# Patient Record
Sex: Female | Born: 1970 | Race: Black or African American | Hispanic: No | Marital: Single | Smoking: Never smoker
Health system: Southern US, Community
[De-identification: ages and names within clinical notes are randomized; demographics above are authoritative.]

## PROBLEM LIST (undated history)

## (undated) DIAGNOSIS — R55 Syncope and collapse: Secondary | ICD-10-CM

## (undated) DIAGNOSIS — R7611 Nonspecific reaction to tuberculin skin test without active tuberculosis: Secondary | ICD-10-CM

## (undated) DIAGNOSIS — S8262XA Displaced fracture of lateral malleolus of left fibula, initial encounter for closed fracture: Secondary | ICD-10-CM

## (undated) HISTORY — DX: Nonspecific reaction to tuberculin skin test without active tuberculosis: R76.11

## (undated) HISTORY — DX: Syncope and collapse: R55

---

## 2006-03-18 ENCOUNTER — Encounter: Admission: RE | Admit: 2006-03-18 | Discharge: 2006-03-18 | Payer: Self-pay | Admitting: Obstetrics and Gynecology

## 2011-11-07 LAB — HM PAP SMEAR

## 2012-04-06 ENCOUNTER — Ambulatory Visit (HOSPITAL_BASED_OUTPATIENT_CLINIC_OR_DEPARTMENT_OTHER)
Admission: RE | Admit: 2012-04-06 | Discharge: 2012-04-06 | Disposition: A | Discharge status: Home / Self Care | Payer: BC Managed Care – PPO | Source: Ambulatory Visit | Attending: Family Medicine | Admitting: Family Medicine

## 2012-04-06 ENCOUNTER — Encounter: Payer: Self-pay | Admitting: Family Medicine

## 2012-04-06 ENCOUNTER — Ambulatory Visit (INDEPENDENT_AMBULATORY_CARE_PROVIDER_SITE_OTHER): Payer: BC Managed Care – PPO | Admitting: Family Medicine

## 2012-04-06 VITALS — BP 116/76 | HR 68 | Temp 98.4°F | Ht 66.0 in | Wt 178.8 lb

## 2012-04-06 DIAGNOSIS — Z0000 Encounter for general adult medical examination without abnormal findings: Secondary | ICD-10-CM | POA: Insufficient documentation

## 2012-04-06 DIAGNOSIS — T148 Other injury of unspecified body region: Secondary | ICD-10-CM

## 2012-04-06 DIAGNOSIS — E049 Nontoxic goiter, unspecified: Secondary | ICD-10-CM

## 2012-04-06 DIAGNOSIS — T148XXA Other injury of unspecified body region, initial encounter: Secondary | ICD-10-CM

## 2012-04-06 DIAGNOSIS — E010 Iodine-deficiency related diffuse (endemic) goiter: Secondary | ICD-10-CM | POA: Insufficient documentation

## 2012-04-06 DIAGNOSIS — W57XXXA Bitten or stung by nonvenomous insect and other nonvenomous arthropods, initial encounter: Secondary | ICD-10-CM

## 2012-04-06 LAB — LIPID PANEL
Cholesterol: 172 mg/dL (ref 0–200)
HDL: 83.8 mg/dL (ref >39.00)
Total CHOL/HDL Ratio: 2
Triglycerides: 99 mg/dL (ref 0.0–149.0)
VLDL: 19.8 mg/dL (ref 0.0–40.0)

## 2012-04-06 LAB — BASIC METABOLIC PANEL
CO2: 30 mEq/L (ref 19–32)
Calcium: 9.1 mg/dL (ref 8.4–10.5)
Creatinine, Ser: 0.8 mg/dL (ref 0.4–1.2)
GFR: 100.69 mL/min (ref >60.00)
Glucose, Bld: 99 mg/dL (ref 70–99)
Sodium: 140 mEq/L (ref 135–145)

## 2012-04-06 LAB — TSH: TSH: 0.5 u[IU]/mL (ref 0.35–5.50)

## 2012-04-06 LAB — CBC WITH DIFFERENTIAL/PLATELET
Basophils Absolute: 0 10*3/uL (ref 0.0–0.1)
HCT: 38.9 % (ref 36.0–46.0)
Lymphs Abs: 1.7 10*3/uL (ref 0.7–4.0)
MCHC: 31 g/dL (ref 30.0–36.0)
MCV: 75 fl — ABNORMAL LOW (ref 78.0–100.0)
Monocytes Relative: 8.5 % (ref 3.0–12.0)
Neutro Abs: 2.2 10*3/uL (ref 1.4–7.7)
RBC: 5.18 Mil/uL — ABNORMAL HIGH (ref 3.87–5.11)
RDW: 15 % — ABNORMAL HIGH (ref 11.5–14.6)

## 2012-04-06 MED ORDER — TRIAMCINOLONE ACETONIDE 0.1 % EX OINT: TOPICAL_OINTMENT | Freq: Two times a day (BID) | CUTANEOUS | Status: AC

## 2012-04-06 NOTE — Assessment & Plan Note (Signed)
New.  Get [place] and labs to assess.  Will follow.

## 2012-04-06 NOTE — Progress Notes (Signed)
  Subjective:    Patient ID: Lori Gibson, female    DOB: 07-May-1971, 41 y.o.   MRN: [id number]  HPI New to establish.  GYN- [facility]  Has never had PCP physical.  UTD on GYN.  No concerns today.   Review of Systems Patient reports no vision/ hearing changes, adenopathy,fever, weight change,  persistant/recurrent hoarseness , swallowing issues, chest pain, palpitations, edema, persistant/recurrent cough, hemoptysis, dyspnea (rest/exertional/paroxysmal nocturnal), gastrointestinal bleeding (melena, rectal bleeding), abdominal pain, significant heartburn, bowel changes, GU symptoms (dysuria, hematuria, incontinence), Gyn symptoms (abnormal  bleeding, pain),  syncope, focal weakness, memory loss, numbness & tingling, skin/hair/nail changes, abnormal bruising or bleeding, anxiety, or depression.     Objective:   Physical Exam General Appearance:    Alert, cooperative, no distress, appears stated age  Head:    Normocephalic, without obvious abnormality, atraumatic  Eyes:    PERRL, conjunctiva/corneas clear, EOM's intact, fundi    benign, both eyes  Ears:    Normal TM's and external ear canals, both ears  Nose:   Nares normal, septum midline, mucosa normal, no drainage    or sinus tenderness  Throat:   Lips, mucosa, and tongue normal; teeth and gums normal  Neck:   Supple, symmetrical, trachea midline, no adenopathy;    Thyroid: diffusely enlarged, no tenderness/nodules  Back:     Symmetric, no curvature, ROM normal, no CVA tenderness  Lungs:     Clear to auscultation bilaterally, respirations unlabored  Chest Wall:    No tenderness or deformity   Heart:    Regular rate and rhythm, S1 and S2 normal, no murmur, rub   or gallop  Breast Exam:    Deferred to GYN  Abdomen:     Soft, non-tender, bowel sounds active all four quadrants,    no masses, no organomegaly  Genitalia:    Deferred to GYN  Rectal:    Extremities:   Extremities normal, atraumatic, no cyanosis or edema  Pulses:   2+ and  symmetric all extremities  Skin:   Skin color, texture, turgor normal, multiple insect bites on abdomen  Lymph nodes:   Cervical, supraclavicular, and axillary nodes normal  Neurologic:   CNII-XII intact, normal strength, sensation and reflexes    throughout          Assessment & Plan:

## 2012-04-06 NOTE — Patient Instructions (Addendum)
We'll notify you of your lab results and make any changes if needed We'll call you with your [place] results Keep up the good work!  You look great! Use the Triamcinolone twice daily for itching as needed Call with any questions or concerns Think of [place] as your home base! Welcome!  We're glad to have you!!!

## 2012-04-06 NOTE — Assessment & Plan Note (Signed)
New.  Start triamcinolone ointment prn.  Reviewed supportive care and red flags that should prompt return.  Pt expressed understanding and is in agreement w/ plan.

## 2012-04-06 NOTE — Assessment & Plan Note (Signed)
New.  Pt's PE WNL w/ exception of thyromegaly.  Check labs.  Get [place].  Anticipatory guidance provided.

## 2012-04-07 ENCOUNTER — Encounter: Payer: Self-pay | Admitting: *Deleted

## 2012-04-10 LAB — VITAMIN D 1,25 DIHYDROXY: Vitamin D2 1, 25 (OH)2: <8 pg/mL

## 2012-05-04 ENCOUNTER — Ambulatory Visit (INDEPENDENT_AMBULATORY_CARE_PROVIDER_SITE_OTHER): Payer: BC Managed Care – PPO | Admitting: Family Medicine

## 2012-05-04 ENCOUNTER — Encounter: Payer: Self-pay | Admitting: Family Medicine

## 2012-05-04 VITALS — BP 105/68 | HR 83 | Temp 98.4°F | Ht 66.0 in | Wt 178.0 lb

## 2012-05-04 DIAGNOSIS — R55 Syncope and collapse: Secondary | ICD-10-CM | POA: Insufficient documentation

## 2012-05-04 NOTE — Progress Notes (Signed)
  Subjective:    Patient ID: Lori Gibson, female    DOB: 1971-01-21, 41 y.o.   MRN: [id number]  HPI Hospital f/u- passed out on 7/28 during exercise (doing sprints) in the early evening.  'i just went down' after completing a 1 lap sprint.  Felt very winded and when she went to get water, 'i don't remember anything else'.  Was out for <5 minutes.  There was report of 'seizure like' activity for 10-15 seconds consisting of 'eyes rolling back and twitching and unresponsive'.  No incontinence.  No tongue biting.  No involuntary movement of extremities.  Had normal head CT in ER ([facility]), sinus arrhythmia on EKG, and normal labs.  Pt was told in ER that BP was low.  Yesterday was extremely tired, today feeling better.   Review of Systems For ROS see HPI     Objective:   Physical Exam  Vitals reviewed. Constitutional: She is oriented to person, place, and time. She appears well-developed and well-nourished. No distress.  HENT:  Head: Normocephalic and atraumatic.  Eyes: Conjunctivae and EOM are normal. Pupils are equal, round, and reactive to light.  Neck: Normal range of motion. Neck supple. No thyromegaly present.  Cardiovascular: Normal rate, regular rhythm, normal heart sounds and intact distal pulses.   No murmur heard. Pulmonary/Chest: Effort normal and breath sounds normal. No respiratory distress.  Abdominal: Soft. She exhibits no distension. There is no tenderness.  Musculoskeletal: She exhibits no edema.  Lymphadenopathy:    She has no cervical adenopathy.  Neurological: She is alert and oriented to person, place, and time.  Skin: Skin is warm and dry.  Psychiatric: She has a normal mood and affect. Her behavior is normal.          Assessment & Plan:

## 2012-05-04 NOTE — Assessment & Plan Note (Signed)
New.  Reviewed ER record from [facility].  Pt's report of events is more consistent w/ vagal episode after becoming winded, leaning forward on knees and then standing upright to get water.  Based on witnessed collapse and lack of incontinence, tongue biting, involuntary extremity movement, post-ictal state or electrolyte abnormalities- true seizure is unlikely.  Pt not orthostatic in office today.  Encouraged increased hydration, increased salt intake and easing into exercise.  Reviewed supportive care and red flags that should prompt return.  Pt expressed understanding and is in agreement w/ plan.

## 2012-05-04 NOTE — Patient Instructions (Addendum)
The good news is that you are not orthostatic here in the office Drink plenty of fluids Increase your salt intake! REST!  You energy will return shortly Call with any questions or concerns Hang in there!!!

## 2013-04-14 IMAGING — US US SOFT TISSUE HEAD/NECK
1 series · 13 of 25 positions shown · non-contrast
Comparison: None

CLINICAL DATA: Thyromegaly

THYROID ULTRASOUND
TECHNIQUE: Ultrasound examination of the thyroid gland and adjacent
soft tissues was performed.

[Series 1: us soft tissue head/neck · 0.08mm/px · 13 of 49 slices shown]
[im 1/49]
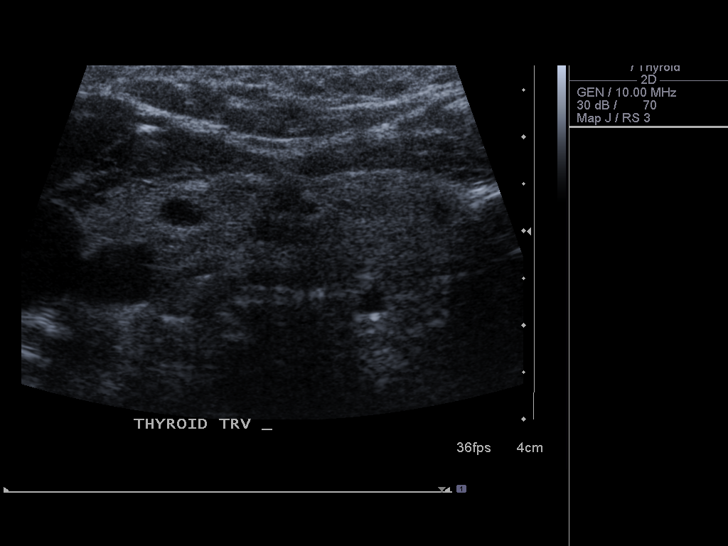
[im 5/49]
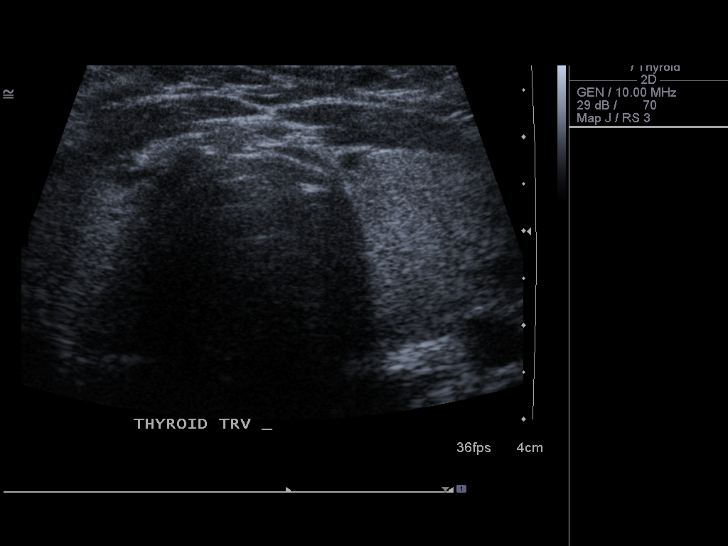
[im 9/49]
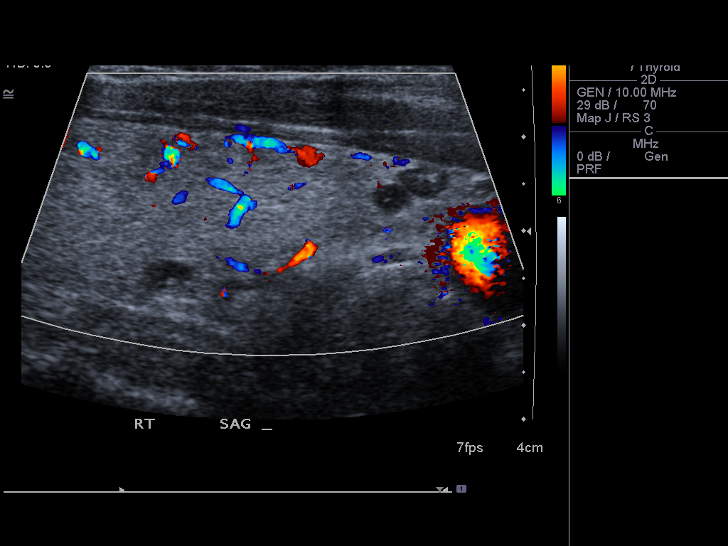
[im 13/49]
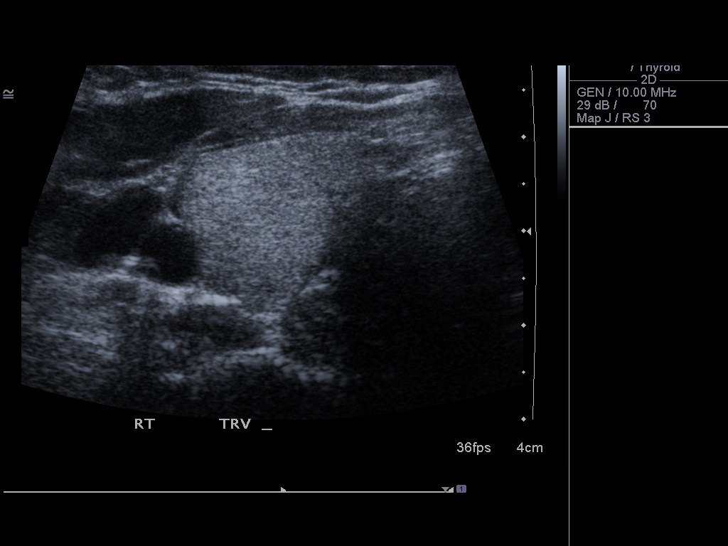
[im 17/49]
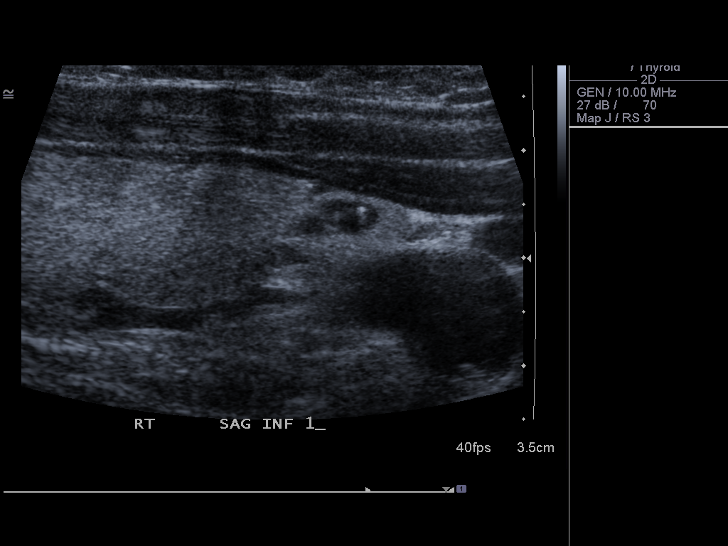
[im 21/49]
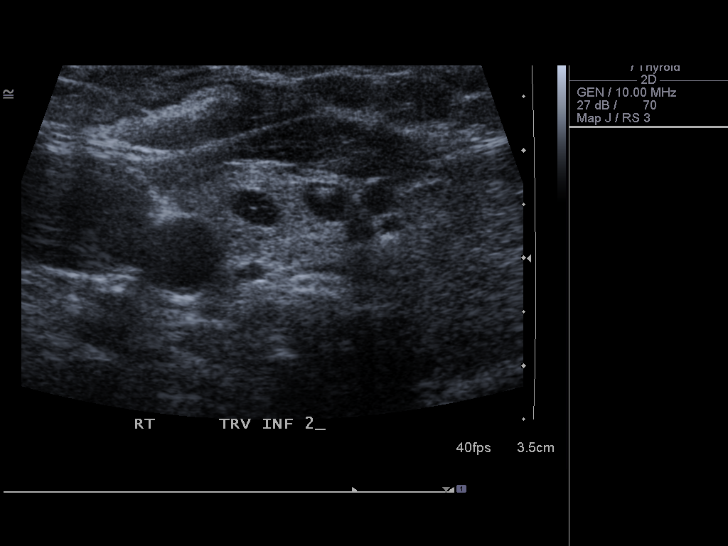
[im 25/49]
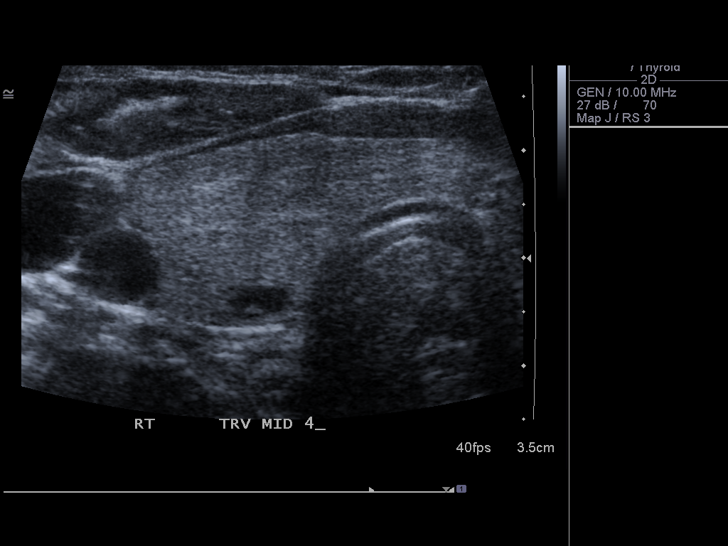
[im 29/49]
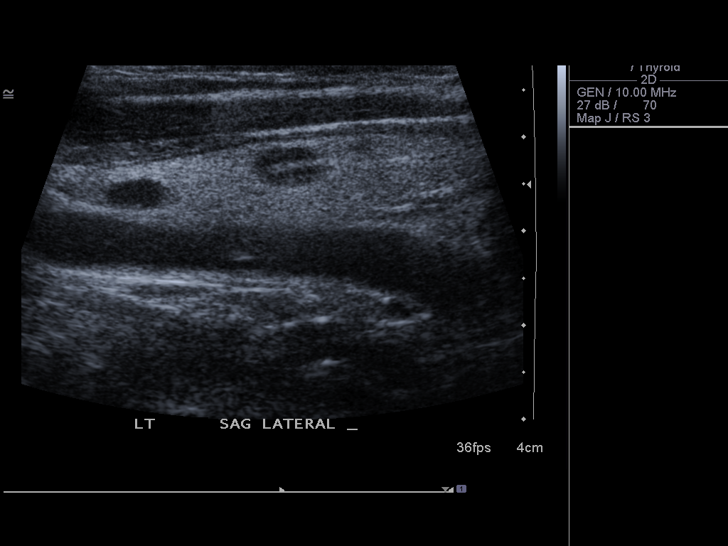
[im 33/49]
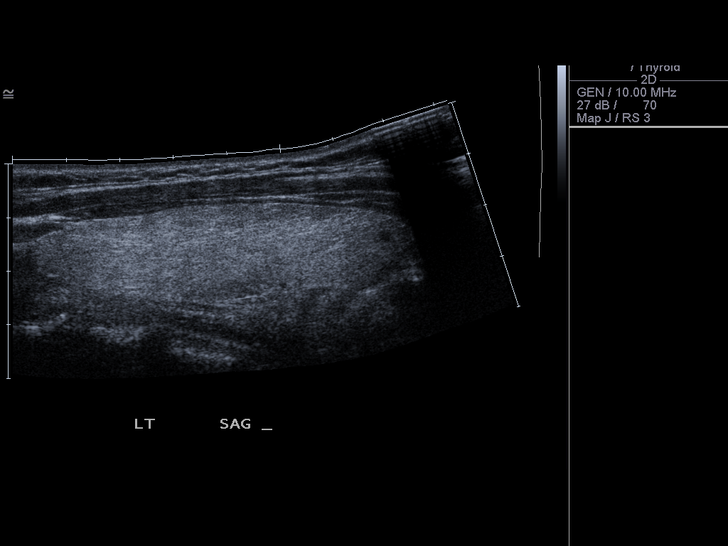
[im 37/49]
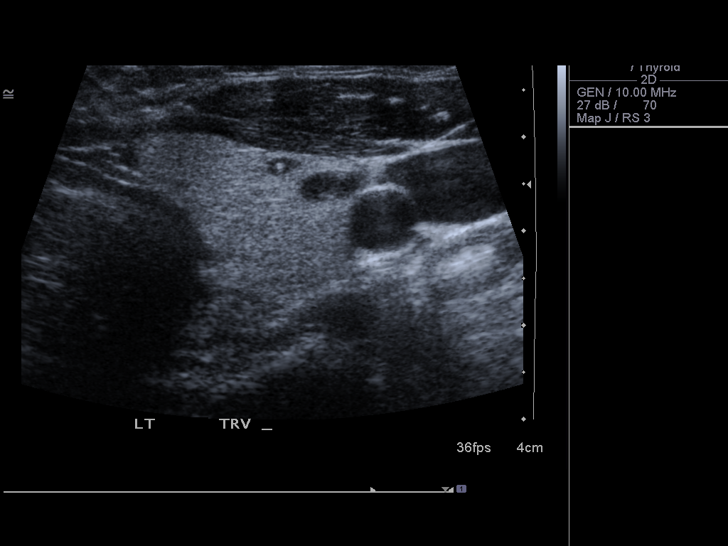
[im 41/49]
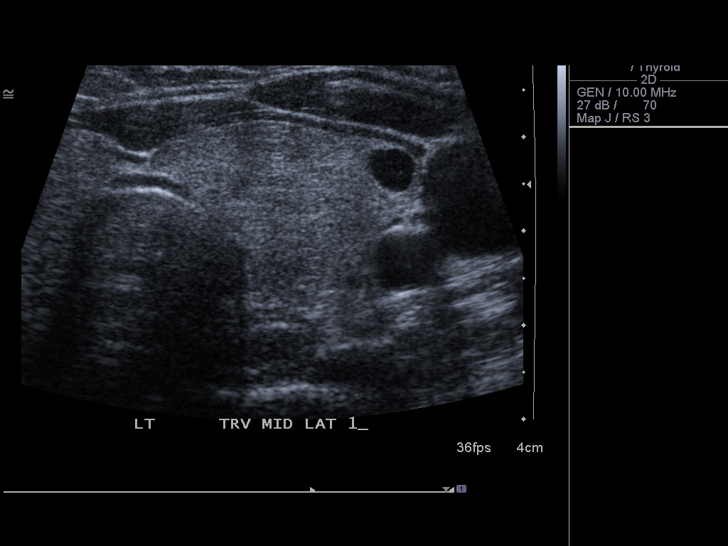
[im 45/49]
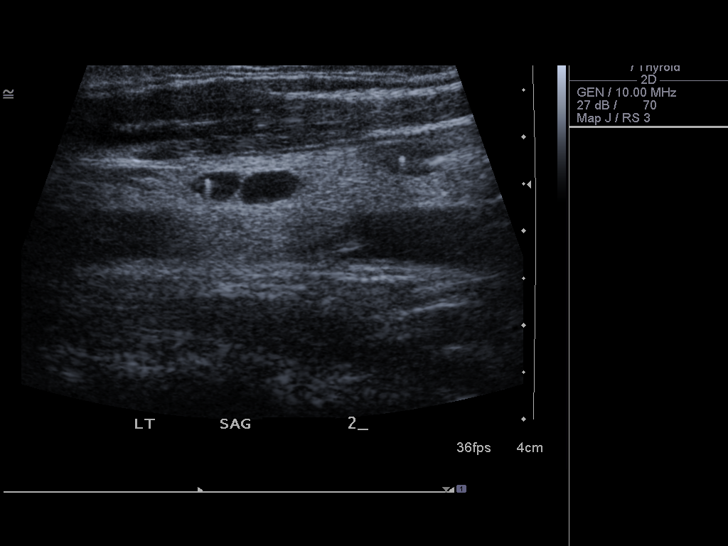
[im 49/49]
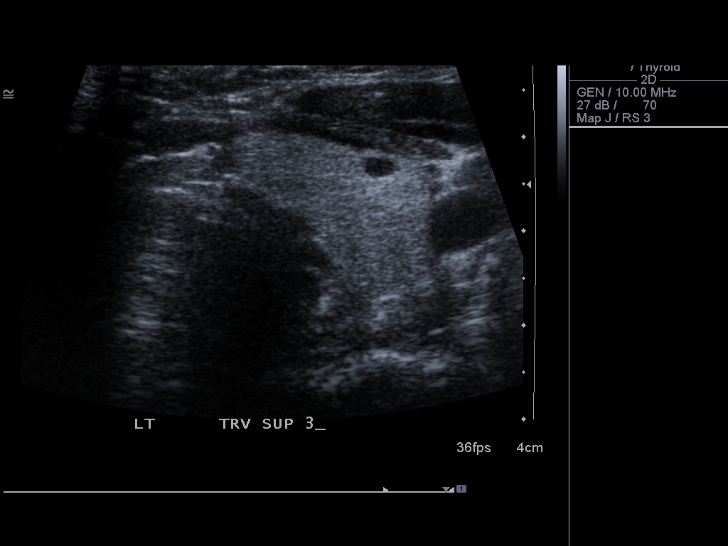

[13 of 25 positions shown; findings below may reference images not displayed]

FINDINGS: Right thyroid lobe:  6.8 x 1.8 x 2.3 cm. Homogeneous parenchymal
echogenicity.
Left thyroid lobe:  7.1 x 1.8 x 2.3 cm.  Homogeneous parenchymal
echogenicity.
Isthmus:  5 mm thick

Focal nodules:  Multiple tiny nonspecific bilateral pulmonary
nodules are identified.  Largest nodule in the right lobe is at the
lower pole, measuring 6 x 8 x 3 mm.  Largest nodule in the left
lobe is at the mid to upper pole, 1.1 x 0.5 x 0.4 cm.
No dominant thyroid mass or calcification.

Lymphadenopathy:  None identified
IMPRESSION: Enlarged thyroid gland with bilateral nonspecific nodules, largest
measuring 1.1 cm in greatest dimension.
Findings do not meet current SRU consensus criteria for biopsy.
Follow-up by clinical exam is recommended.  If patient has known
risk factors for thyroid carcinoma, consider follow-up ultrasound
in 12 months.  If patient is clinically hyperthyroid, consider
nuclear medicine thyroid uptake and scan.

This recommendation follows the consensus statement:  Management of
Thyroid Nodules Detected as US:  Society of Radiologists in
800.

## 2014-10-06 HISTORY — PX: ANKLE FRACTURE SURGERY: SHX122

## 2015-01-30 NOTE — Other (Signed)
Patient verified name, DOB, and surgery as listed in [facility].    TYPE  CASE: 1b            Orders:  received.  Labs per surgeon:  none   Labs per anesthesia protocol: none  EKG  :  none    Instructed patient to continue previous medications as prescribed prior to surgery and  to take the following medications the day of surgery according to anesthesia guidelines with a small sip of water : none       Continue all previous medications unless otherwise directed.     Instructed patient to hold  the following medications prior to surgery: none    Patient instructed on the following and verbalized understanding:  Arrive at opc entrance, time of arrival to be called the day before by 1700.  Responsible adult must drive patient to and from hospital, stay during surgery and 24 hours postoperatively.  Npo after midnight including gum, mints and ice chips.  Use hibiclens in the shower the night before and the morning of surgery.  Leave all valuables at home. Instructed on no jewelry or body piercings on the dos.  Bring insurance card and ID.  No perfumes, oil, powder, colognes, makeup or  lotions on the skin.    Patient verbalized understanding of all instructions and provided all medical/health information to the best of their ability.

## 2015-01-31 ENCOUNTER — Inpatient Hospital Stay

## 2015-02-01 ENCOUNTER — Inpatient Hospital Stay: Payer: BLUE CROSS/BLUE SHIELD

## 2015-02-01 ENCOUNTER — Ambulatory Visit: Admit: 2015-02-01 | Payer: BLUE CROSS/BLUE SHIELD

## 2015-02-01 LAB — HCG URINE, QL. - POC: Pregnancy test,urine (POC): NEGATIVE

## 2015-02-01 MED ORDER — ONDANSETRON (PF) 4 MG/2 ML INJECTION
4 mg/2 mL | INTRAMUSCULAR | Status: AC
Start: 2015-02-01 — End: ?

## 2015-02-01 MED ORDER — LIDOCAINE (PF) 20 MG/ML (2 %) IJ SOLN
20 mg/mL (2 %) | INTRAMUSCULAR | Status: DC | PRN
Start: 2015-02-01 — End: 2015-02-01
  Administered 2015-02-01: 18:00:00 via INTRAVENOUS

## 2015-02-01 MED ORDER — EPINEPHRINE (PF) 1 MG/ML INJECTION
11 mg/mL ( mL) | INTRAMUSCULAR | Status: DC | PRN
Start: 2015-02-01 — End: 2015-02-01
  Administered 2015-02-01: 18:00:00 via PERINEURAL

## 2015-02-01 MED ORDER — LACTATED RINGERS IV
INTRAVENOUS | Status: DC
Start: 2015-02-01 — End: 2015-02-01
  Administered 2015-02-01: 17:00:00 via INTRAVENOUS

## 2015-02-01 MED ORDER — PROPOFOL 10 MG/ML IV EMUL
10 mg/mL | INTRAVENOUS | Status: DC | PRN
Start: 2015-02-01 — End: 2015-02-01
  Administered 2015-02-01: 18:00:00 via INTRAVENOUS

## 2015-02-01 MED ORDER — HYDROMORPHONE (PF) 2 MG/ML IJ SOLN
2 mg/mL | INTRAMUSCULAR | Status: DC | PRN
Start: 2015-02-01 — End: 2015-02-01

## 2015-02-01 MED ORDER — OXYCODONE 5 MG TAB
5 mg | ORAL | Status: DC | PRN
Start: 2015-02-01 — End: 2015-02-01

## 2015-02-01 MED ORDER — ONDANSETRON (PF) 4 MG/2 ML INJECTION
4 mg/2 mL | INTRAMUSCULAR | Status: DC | PRN
Start: 2015-02-01 — End: 2015-02-01
  Administered 2015-02-01: 18:00:00 via INTRAVENOUS

## 2015-02-01 MED ORDER — LIDOCAINE (PF) 20 MG/ML (2 %) IV SYRINGE
100 mg/5 mL (2 %) | INTRAVENOUS | Status: AC
Start: 2015-02-01 — End: ?

## 2015-02-01 MED ORDER — FENTANYL CITRATE (PF) 50 MCG/ML IJ SOLN
50 mcg/mL | Freq: Once | INTRAMUSCULAR | Status: AC
Start: 2015-02-01 — End: 2015-02-01
  Administered 2015-02-01: 18:00:00 via INTRAVENOUS

## 2015-02-01 MED ORDER — LACTATED RINGERS IV
INTRAVENOUS | Status: DC
Start: 2015-02-01 — End: 2015-02-01

## 2015-02-01 MED ORDER — MIDAZOLAM 1 MG/ML IJ SOLN
1 mg/mL | Freq: Once | INTRAMUSCULAR | Status: AC
Start: 2015-02-01 — End: 2015-02-01
  Administered 2015-02-01: 17:00:00 via INTRAVENOUS

## 2015-02-01 MED ORDER — LIDOCAINE HCL 1 % (10 MG/ML) IJ SOLN
10 mg/mL (1 %) | INTRAMUSCULAR | Status: DC | PRN
Start: 2015-02-01 — End: 2015-02-01

## 2015-02-01 MED ORDER — DEXAMETHASONE SODIUM PHOSPHATE 4 MG/ML IJ SOLN
4 mg/mL | INTRAMUSCULAR | Status: AC
Start: 2015-02-01 — End: ?

## 2015-02-01 MED ORDER — MIDAZOLAM 1 MG/ML IJ SOLN
1 mg/mL | INTRAMUSCULAR | Status: DC | PRN
Start: 2015-02-01 — End: 2015-02-01
  Administered 2015-02-01: 18:00:00 via INTRAVENOUS

## 2015-02-01 MED ORDER — PROMETHAZINE 25 MG/ML INJECTION
25 mg/mL | INTRAMUSCULAR | Status: DC | PRN
Start: 2015-02-01 — End: 2015-02-01

## 2015-02-01 MED ORDER — PROPOFOL 10 MG/ML IV EMUL
10 mg/mL | INTRAVENOUS | Status: AC
Start: 2015-02-01 — End: ?

## 2015-02-01 MED ORDER — DEXAMETHASONE SODIUM PHOSPHATE 4 MG/ML IJ SOLN
4 mg/mL | INTRAMUSCULAR | Status: DC | PRN
Start: 2015-02-01 — End: 2015-02-01
  Administered 2015-02-01: 18:00:00 via INTRAVENOUS

## 2015-02-01 MED ORDER — CEFAZOLIN 2 GRAM/50 ML NS IVPB
Freq: Once | INTRAVENOUS | Status: DC
Start: 2015-02-01 — End: 2015-02-01

## 2015-02-01 MED FILL — FENTANYL CITRATE (PF) 50 MCG/ML IJ SOLN: 50 mcg/mL | INTRAMUSCULAR | Qty: 2

## 2015-02-01 MED FILL — ONDANSETRON (PF) 4 MG/2 ML INJECTION: 4 mg/2 mL | INTRAMUSCULAR | Qty: 2

## 2015-02-01 MED FILL — CEFAZOLIN 2 GRAM/50 ML NS IVPB: INTRAVENOUS | Qty: 50

## 2015-02-01 MED FILL — MIDAZOLAM 1 MG/ML IJ SOLN: 1 mg/mL | INTRAMUSCULAR | Qty: 2

## 2015-02-01 MED FILL — LIDOCAINE (PF) 20 MG/ML (2 %) IV SYRINGE: 100 mg/5 mL (2 %) | INTRAVENOUS | Qty: 5

## 2015-02-01 MED FILL — PROPOFOL 10 MG/ML IV EMUL: 10 mg/mL | INTRAVENOUS | Qty: 20

## 2015-02-01 MED FILL — DEXAMETHASONE SODIUM PHOSPHATE 4 MG/ML IJ SOLN: 4 mg/mL | INTRAMUSCULAR | Qty: 1

## 2015-02-01 NOTE — Anesthesia Procedure Notes (Signed)
Peripheral Block    Start time: 02/01/2015 1:31 PM  End time: 02/01/2015 1:35 PM  Block Type: left popliteal  Reason for block: at surgeon's request and post-op pain management  Staffing  Anesthesiologist: Gillermina HuHERTY, Tannar Broker R  Performed by: anesthesiologist   Prep  Risks and benefits discussed with the patient and plans are to proceed  Site marked, Timeout performed, 13:30  Monitoring: standard ASA monitoring, continuous pulse ox, heart rate, responsive to questions, oxygen and frequent vital sign checks  Injection Technique: single-shot  Procedures: ultrasound guided and nerve stimulator  Prep Solution(s): chlorhexidine  Needle  Needle: 20G Stimuplex (Echogenic)  Needle localization: nerve stimulator and ultrasound guidance  Minimal motor response <0.5 mA and >0.3 mA  Medication Injected: 40mL 0.375% ropivacaine (3 cc 1% lidocaine local anesthetic at insertion site) with epi 1:200K    Assessment  Injection Assessment: incremental injection every 5 mL, local visualized surrounding nerve on ultrasound, negative aspiration for blood, no paresthesia, no intravascular symptoms and ultrasound image on chart  Patient tolerated without any apparent complications  Additional Notes  Pt decided just before heading back to OR that she would like a PNB.    Peripheral Block    Start time: 02/01/2015 1:37 PM  End time: 02/01/2015 1:39 PM  Block Type: left adductor canal  Reason for block: at surgeon's request and post-op pain management  Staffing  Anesthesiologist: Gillermina HuHERTY, Orlen Leedy R  Performed by: anesthesiologist   Prep  Risks and benefits discussed with the patient and plans are to proceed  Site marked, Timeout performed, 13:36  Monitoring: standard ASA monitoring, continuous pulse ox, heart rate, responsive to questions, oxygen and frequent vital sign checks  Injection Technique: single-shot  Procedures: ultrasound guided and nerve stimulator  Prep Solution(s): chlorhexidine  Needle  Needle: 20G Stimuplex (Echogenic)  Needle  localization: nerve stimulator and ultrasound guidance  Minimal motor response <0.5 mA and >0.3 mA  Medication Injected: 20mL 0.375% ropivacaine (3 cc 1% lidocaine local anesthetic at insertion site) with epi 1:200K    Assessment  Injection Assessment: incremental injection every 5 mL, local visualized surrounding nerve on ultrasound, negative aspiration for blood, no paresthesia, no intravascular symptoms and ultrasound image on chart  Patient tolerated without any apparent complications

## 2015-02-01 NOTE — Op Note (Signed)
[facility]                            [address]                           [place] [id number]                                [phone]                                OPERATIVE REPORT    NAME:  Jean Lee, Jean Lee                              MR:  [id number]  LOC:  OPPAPH  PL            SEX:  F               ACCT:  [id number]  DOB:  1971-05-21            AGE:  43              PT:  S  ADMIT:  02/01/2015          DSCH:  02/01/2015     MSV:        DATE OF PROCEDURE: 02/01/2015    PREOPERATIVE DIAGNOSIS: Left displaced lateral malleolus fracture.    POSTOPERATIVE DIAGNOSIS: Left displaced lateral malleolus fracture.    PROCEDURE PERFORMED:  1. Open reduction, internal fixation of left lateral malleolus fracture.  2. Open reduction, internal fixation with primary repair of left deltoid  ligament rupture.    SURGEON: Nunzio CoryJohn Wesley Womack, MD.    ANESTHESIA: General anesthesia with LMA and popliteal block.    ESTIMATED BLOOD LOSS: Minimal.    TOURNIQUET TIME: 30 minutes at 250 mmHg.    ANTIBIOTIC PROPHYLAXIS: Ancef given prior to procedure.    INDICATIONS FOR PROCEDURE: The patient is a 44 year old African female  with symptomatic left displaced lateral malleolus fracture, who presents  for operative treatment. Risks and benefits of procedure including, but  not limited to risk of anesthetic complications, myocardial infarction,  stroke, and death, as well as surgical complications including damage to  nerves and blood vessels, risk of infection, risk of incomplete pain  relief, risk of malunion, risk of nonunion, risk of arthritis, and need  for additional surgery have been discussed at length with the patient.  She understands the risks and wishes to proceed with surgery at this  time.    DETAILS OF PROCEDURE: The patient's correct operative site was marked  with indelible ink in the preoperative holding area. A block was placed  by Department of Anesthesia. The patient  taken to the operating room and  placed supine. During a preop surgical timeout, the left lower extremity  was identified as correct surgical site and prepped and draped in a  standard sterile fashion using ChloraPrep solution. A direct lateral  approach to the distal fibula was then performed taking care to protect  the superficial peroneal nerve. The underlying fracture site was  identified, curetted out and reduced in anatomic alignment.  Interfragmentary screw followed by a 1/3 semitubular plate was then  affixed using appropriate length locking screws.  The wound was then  irrigated and closed using Monocryl and nylon sutures. There was no  evidence of syndesmotic instability. Medial approach to the medial  malleolus was then performed. The deltoid was repaired using  nonabsorbable sutures. Skin was repaired using Monocryl and nylon  sutures. Sterile dressing was then applied, followed by well-padded  posterior splint. Anesthesia was discontinued. The patient was  subsequently transferred back to the recovery bed and taken to the  recovery room in satisfactory condition. She appeared to tolerate the  procedure well. There were no apparent surgical or anesthetic  complications. All needle and sponge counts were correct.                Eddie North, III, MD     A                This is an unverified document unless signed by physician.    TID:  [id number]                                      DT:  02/01/2015 05:48 P  JOB:  [id number]           DOC#:  [id number]           DD:  02/01/2015    cc:   Georgeanna Lea, MD

## 2015-02-01 NOTE — Anesthesia Pre-Procedure Evaluation (Addendum)
Anesthetic History   No history of anesthetic complications            Review of Systems / Medical History  Pertinent labs reviewed    Pulmonary  Within defined limits                 Neuro/Psych   Within defined limits           Cardiovascular  Within defined limits                Exercise tolerance: >4 METS     GI/Hepatic/Renal  Within defined limits              Endo/Other  Within defined limits           Other Findings              Physical Exam    Airway  Mallampati: I  TM Distance: 4 - 6 cm  Neck ROM: normal range of motion   Mouth opening: Normal     Cardiovascular  Regular rate and rhythm,  S1 and S2 normal,  no murmur, click, rub, or gallop             Dental  No notable dental hx       Pulmonary  Breath sounds clear to auscultation               Abdominal  GI exam deferred       Other Findings            Anesthetic Plan    ASA: 1  Anesthesia type: general          Induction: Intravenous  Anesthetic plan and risks discussed with: Patient      Pt would prefer not to do PNBs at this time.

## 2015-02-01 NOTE — Other (Signed)
Discharge instructions given to friend. Pt has crutches at home. Instructions given. Verbalized understanding and opportunity for questions was given. Pt and belongings taken via wheelchair to car.

## 2015-02-01 NOTE — Brief Op Note (Signed)
BRIEF OPERATIVE NOTE    Date of Procedure: 02/01/2015   Preoperative Diagnosis: Closed fracture of distal lateral malleolus of left ankle, initial encounter [S82.62XA]  Postoperative Diagnosis: Closed fracture of distal lateral malleolus of left ankle, initial encounter [S82.62XA]    Procedure(s):  LEFT  DISTAL LATERAL ANKLE OPEN REDUCTION INTERNAL FIXATION /  Surgeon(s) and Role:     * Eddie NorthJohn W Womack III, MD - Primary  Anesthesia: General   Estimated Blood Loss: min  Specimens: * No specimens in log *   Findings: no  Complications: no  Implants:   Implant Name Type Inv. Item Serial No. Manufacturer Lot No. LRB No. Used Action   PLATE BNE 1/3 TBLR 7H 3.5X81MM --  - [id number]  PLATE BNE 1/3 TBLR 7H 3.5X81MM --   [employer] [place] [id number] Left 1 Implanted   SCR BNE CANC FT 4X14MM TI --  - [id number]  SCR BNE CANC FT 4X14MM TI --   [employer] [place] [id number] Left 2 Implanted   SCR BNE CRTX ST 3.5X12MM TI --  - [id number]  SCR BNE CRTX ST 3.5X12MM TI --   [employer] [place] [id number] Left 2 Implanted   SCR BNE CRTX ST 3.5X14MM TI --  - [id number]  SCR BNE CRTX ST 3.5X14MM TI --   [employer] [place] [id number] Left 2 Implanted   SCR BNE CRTX ST 3.5X18MM TI --  - [id number]   SCR BNE CRTX ST 3.5X18MM TI --    [employer] [place] [id number] Left 1 Implanted

## 2015-02-01 NOTE — Anesthesia Post-Procedure Evaluation (Signed)
Post-Anesthesia Evaluation and Assessment    Patient: Jean Lee MRN: [id number]  SSN: [id number]    Date of Birth: 05-26-1971  Age: 44 y.o.  Sex: female       Cardiovascular Function/Vital Signs  Visit Vitals   Item Reading   ??? BP 142/79 mmHg   ??? Pulse 82   ??? Temp 36.6 ??C (97.9 ??F)   ??? Resp 7   ??? SpO2 96%       Patient is status post general, regional anesthesia for Procedure(s):  LEFT  DISTAL LATERAL ANKLE OPEN REDUCTION INTERNAL FIXATION /  WITH POSS SYNDESMOTIC SCREW .    Nausea/Vomiting: None    Postoperative hydration reviewed and adequate.    Pain:  Pain Scale 1: Visual (02/01/15 1444)  Pain Intensity 1: 0 (02/01/15 1444)   Managed    Neurological Status:   Neuro (WDL): Within Defined Limits (02/01/15 1444)  Neuro  RLE Motor Response: Pharmacologically paralyzed (02/01/15 1444)     Mental Status and Level of Consciousness: Alert and oriented     Pulmonary Status:   RA  Adequate oxygenation and airway patent    Complications related to anesthesia: None    Post-anesthesia assessment completed. No concerns    Signed By: Nehemiah SettleJONATHAN R Aiden Rao, MD     Asra 28, 2016

## 2015-02-01 NOTE — Op Note (Signed)
[facility]                            [address]                           [place] [id number]                                [phone]                                OPERATIVE REPORT    NAME:  Jean Lee, Jean Lee                              MR:  [id number]  LOC:  OPPAPH  PL            SEX:  F               ACCT:  [id number]  DOB:  August 25, 1971            AGE:  43              PT:  S  ADMIT:  02/01/2015          DSCH:  02/01/2015     MSV:        DATE OF PROCEDURE: 02/01/2015    PREOPERATIVE DIAGNOSIS: Left displaced lateral malleolus fracture.    POSTOPERATIVE DIAGNOSIS: Left displaced lateral malleolus fracture.    PROCEDURE PERFORMED:  1. Open reduction, internal fixation of left lateral malleolus fracture.  2. Open reduction, internal fixation with primary repair of left deltoid  ligament rupture.    SURGEON: Nunzio CoryJohn Wesley Womack, MD.    ANESTHESIA: General anesthesia with LMA and popliteal block.    ESTIMATED BLOOD LOSS: Minimal.    TOURNIQUET TIME: 30 minutes at 250 mmHg.    ANTIBIOTIC PROPHYLAXIS: Ancef given prior to procedure.    INDICATIONS FOR PROCEDURE: The patient is a 44 year old African female  with symptomatic left displaced lateral malleolus fracture, who presents  for operative treatment. Risks and benefits of procedure including, but  not limited to risk of anesthetic complications, myocardial infarction,  stroke, and death, as well as surgical complications including damage to  nerves and blood vessels, risk of infection, risk of incomplete pain  relief, risk of malunion, risk of nonunion, risk of arthritis, and need  for additional surgery have been discussed at length with the patient.  She understands the risks and wishes to proceed with surgery at this  time.    DETAILS OF PROCEDURE: The patient's correct operative site was marked  with indelible ink in the preoperative holding area. A block was placed   by Department of Anesthesia. The patient taken to the operating room and  placed supine. During a preop surgical timeout, the left lower extremity  was identified as correct surgical site and prepped and draped in a  standard sterile fashion using ChloraPrep solution. A direct lateral  approach to the distal fibula was then performed taking care to protect  the superficial peroneal nerve. The underlying fracture site was  identified, curetted out and reduced in anatomic alignment.  Interfragmentary screw followed by a 1/3 semitubular plate was then  affixed using appropriate length locking screws.  The wound was then  irrigated and closed using Monocryl and nylon sutures. There was no  evidence of syndesmotic instability. Medial approach to the medial  malleolus was then performed. The deltoid was repaired using  nonabsorbable sutures. Skin was repaired using Monocryl and nylon  sutures. Sterile dressing was then applied, followed by well-padded  posterior splint. Anesthesia was discontinued. The patient was  subsequently transferred back to the recovery bed and taken to the  recovery room in satisfactory condition. She appeared to tolerate the  procedure well. There were no apparent surgical or anesthetic  complications. All needle and sponge counts were correct.                Eddie North, III, MD     A                This is an unverified document unless signed by physician.    TID:  [id number]                                      DT:  02/01/2015 05:48 P  JOB:  [id number]           DOC#:  [id number]           DD:  02/01/2015    cc:   Georgeanna Lea, MD

## 2015-02-01 NOTE — Anesthesia Procedure Notes (Addendum)
Peripheral Block    Start time: 02/01/2015 1:31 PM  End time: 02/01/2015 1:35 PM  Block Type: left popliteal  Reason for block: at surgeon's request and post-op pain management  Staffing  Anesthesiologist: Gillermina HuHERTY, Shonnie Poudrier R  Performed by: anesthesiologist   Prep  Risks and benefits discussed with the patient and plans are to proceed  Site marked, Timeout performed, 13:30  Monitoring: standard ASA monitoring, continuous pulse ox, heart rate, responsive to questions, oxygen and frequent vital sign checks  Injection Technique: single-shot  Procedures: ultrasound guided and nerve stimulator  Prep Solution(s): chlorhexidine  Needle  Needle: 20G Stimuplex (Echogenic)  Needle localization: nerve stimulator and ultrasound guidance  Minimal motor response <0.5 mA and >0.3 mA  Medication Injected: 40mL 0.375% ropivacaine (3 cc 1% lidocaine local anesthetic at insertion site) with epi 1:200K    Assessment  Injection Assessment: incremental injection every 5 mL, local visualized surrounding nerve on ultrasound, negative aspiration for blood, no paresthesia, no intravascular symptoms and ultrasound image on chart  Patient tolerated without any apparent complications  Additional Notes  Pt decided just before heading back to OR that she would like a PNB.    Peripheral Block    Start time: 02/01/2015 1:37 PM  End time: 02/01/2015 1:39 PM  Block Type: left adductor canal  Reason for block: at surgeon's request and post-op pain management  Staffing  Anesthesiologist: Gillermina HuHERTY, Raeley Gilmore R  Performed by: anesthesiologist   Prep  Risks and benefits discussed with the patient and plans are to proceed  Site marked, Timeout performed, 13:36  Monitoring: standard ASA monitoring, continuous pulse ox, heart rate, responsive to questions, oxygen and frequent vital sign checks  Injection Technique: single-shot  Procedures: ultrasound guided and nerve stimulator  Prep Solution(s): chlorhexidine  Needle  Needle: 20G Stimuplex (Echogenic)   Needle localization: nerve stimulator and ultrasound guidance  Minimal motor response <0.5 mA and >0.3 mA  Medication Injected: 20mL 0.375% ropivacaine (3 cc 1% lidocaine local anesthetic at insertion site) with epi 1:200K    Assessment  Injection Assessment: incremental injection every 5 mL, local visualized surrounding nerve on ultrasound, negative aspiration for blood, no paresthesia, no intravascular symptoms and ultrasound image on chart  Patient tolerated without any apparent complications

## 2015-02-02 MED FILL — PROPOFOL 10 MG/ML IV EMUL: 10 mg/mL | INTRAVENOUS | Qty: 200

## 2015-02-02 MED FILL — LIDOCAINE (PF) 20 MG/ML (2 %) IJ SOLN: 20 mg/mL (2 %) | INTRAMUSCULAR | Qty: 60

## 2015-02-02 MED FILL — DEXAMETHASONE SODIUM PHOSPHATE 4 MG/ML IJ SOLN: 4 mg/mL | INTRAMUSCULAR | Qty: 4

## 2015-02-02 MED FILL — NAROPIN (PF) 5 MG/ML (0.5 %) INJECTION SOLUTION: 5 mg/mL (0. %) | INTRAMUSCULAR | Qty: 45

## 2015-02-02 MED FILL — ONDANSETRON (PF) 4 MG/2 ML INJECTION: 4 mg/2 mL | INTRAMUSCULAR | Qty: 4

## 2015-04-17 LAB — HM PAP SMEAR

## 2015-04-17 LAB — HM MAMMOGRAPHY: HM Mammogram: NORMAL (ref 0–4)

## 2016-04-16 ENCOUNTER — Encounter: Payer: Self-pay | Admitting: Family Medicine

## 2016-04-16 ENCOUNTER — Ambulatory Visit (INDEPENDENT_AMBULATORY_CARE_PROVIDER_SITE_OTHER): Payer: BLUE CROSS/BLUE SHIELD | Admitting: Family Medicine

## 2016-04-16 VITALS — BP 112/71 | HR 61 | Temp 99.3°F | Resp 16 | Ht 66.0 in | Wt 181.1 lb

## 2016-04-16 DIAGNOSIS — Z23 Encounter for immunization: Secondary | ICD-10-CM | POA: Diagnosis not present

## 2016-04-16 DIAGNOSIS — Z0000 Encounter for general adult medical examination without abnormal findings: Secondary | ICD-10-CM

## 2016-04-16 DIAGNOSIS — E010 Iodine-deficiency related diffuse (endemic) goiter: Secondary | ICD-10-CM

## 2016-04-16 DIAGNOSIS — Z01419 Encounter for gynecological examination (general) (routine) without abnormal findings: Secondary | ICD-10-CM | POA: Diagnosis not present

## 2016-04-16 DIAGNOSIS — E049 Nontoxic goiter, unspecified: Secondary | ICD-10-CM | POA: Diagnosis not present

## 2016-04-16 DIAGNOSIS — R5383 Other fatigue: Secondary | ICD-10-CM | POA: Diagnosis not present

## 2016-04-16 LAB — BASIC METABOLIC PANEL
BUN: 11 mg/dL (ref 6–23)
CALCIUM: 9.5 mg/dL (ref 8.4–10.5)
CO2: 26 meq/L (ref 19–32)
CREATININE: 0.85 mg/dL (ref 0.40–1.20)
Chloride: 99 mEq/L (ref 96–112)
GFR: 93.4 mL/min (ref >60.00)
GLUCOSE: 88 mg/dL (ref 70–99)
Potassium: 4.3 mEq/L (ref 3.5–5.1)
SODIUM: 141 meq/L (ref 135–145)

## 2016-04-16 LAB — LIPID PANEL
CHOLESTEROL: 163 mg/dL (ref 0–200)
HDL: 60.5 mg/dL (ref >39.00)
LDL CALC: 83 mg/dL (ref 0–99)
NonHDL: 102.93
TRIGLYCERIDES: 100 mg/dL (ref 0.0–149.0)
Total CHOL/HDL Ratio: 3
VLDL: 20 mg/dL (ref 0.0–40.0)

## 2016-04-16 LAB — HEPATIC FUNCTION PANEL
ALBUMIN: 4.3 g/dL (ref 3.5–5.2)
ALT: 13 U/L (ref 0–35)
AST: 17 U/L (ref 0–37)
Alkaline Phosphatase: 57 U/L (ref 39–117)
Bilirubin, Direct: 0.1 mg/dL (ref 0.0–0.3)
TOTAL PROTEIN: 7.6 g/dL (ref 6.0–8.3)
Total Bilirubin: 0.5 mg/dL (ref 0.2–1.2)

## 2016-04-16 LAB — POCT URINALYSIS DIPSTICK
BILIRUBIN UA: NEGATIVE
Glucose, UA: NEGATIVE
KETONES UA: NEGATIVE
LEUKOCYTES UA: NEGATIVE
NITRITE UA: NEGATIVE
PH UA: 7
RBC UA: NEGATIVE
Spec Grav, UA: <=1.005
Urobilinogen, UA: 0.2

## 2016-04-16 LAB — CBC WITH DIFFERENTIAL/PLATELET
BASOS ABS: 0.1 10*3/uL (ref 0.0–0.1)
Basophils Relative: 2.5 % (ref 0.0–3.0)
EOS ABS: 0 10*3/uL (ref 0.0–0.7)
Eosinophils Relative: 0.8 % (ref 0.0–5.0)
HEMATOCRIT: 40.2 % (ref 36.0–46.0)
Hemoglobin: 12.7 g/dL (ref 12.0–15.0)
LYMPHS PCT: 48.9 % — AB (ref 12.0–46.0)
Lymphs Abs: 2.2 10*3/uL (ref 0.7–4.0)
MCHC: 31.5 g/dL (ref 30.0–36.0)
MCV: 72.7 fl — AB (ref 78.0–100.0)
MONOS PCT: 7.4 % (ref 3.0–12.0)
Monocytes Absolute: 0.3 10*3/uL (ref 0.1–1.0)
NEUTROS ABS: 1.8 10*3/uL (ref 1.4–7.7)
Neutrophils Relative %: 40.4 % — ABNORMAL LOW (ref 43.0–77.0)
PLATELETS: 160 10*3/uL (ref 150.0–400.0)
RBC: 5.53 Mil/uL — AB (ref 3.87–5.11)
RDW: 15.3 % (ref 11.5–15.5)
WBC: 4.6 10*3/uL (ref 4.0–10.5)

## 2016-04-16 LAB — B12 AND FOLATE PANEL
FOLATE: 11.2 ng/mL (ref >5.9)
VITAMIN B 12: 477 pg/mL (ref 211–911)

## 2016-04-16 LAB — TSH: TSH: 0.45 u[IU]/mL (ref 0.35–4.50)

## 2016-04-16 LAB — VITAMIN D 25 HYDROXY (VIT D DEFICIENCY, FRACTURES): VITD: 26.46 ng/mL — ABNORMAL LOW (ref 30.00–100.00)

## 2016-04-16 NOTE — Patient Instructions (Signed)
Follow up in 1 year or as needed We'll notify you of your lab results and make any changes if needed Continue to work on healthy diet and regular exercise- you can do it! We'll call you with your GYN and thyroid ultrasound appt Call with any questions or concerns Welcome Back!

## 2016-04-16 NOTE — Progress Notes (Signed)
   Subjective:    Patient ID: Lori Gibson, female    DOB: 10-28-1970, 45 y.o.   MRN: [id number]  HPI Pt to re-establish.  [facility]  CPE- pt is UTD on pap, due for repeat mammo.  Would like referral to GYN.  Due for Tdap.  Also due for repeat thyroid US.   Review of Systems Patient reports no vision/ hearing changes, adenopathy,fever, weight change,  persistant/recurrent hoarseness , swallowing issues, chest pain, palpitations, edema, persistant/recurrent cough, hemoptysis, gastrointestinal bleeding (melena, rectal bleeding), abdominal pain, significant heartburn, bowel changes, GU symptoms (dysuria, hematuria, incontinence), Gyn symptoms (abnormal  bleeding, pain),  syncope, focal weakness, memory loss, numbness & tingling, skin/hair/nail changes, abnormal bruising or bleeding, anxiety, or depression.  + fatigue + SOB w/ heat and humidity    Objective:   Physical Exam General Appearance:    Alert, cooperative, no distress, appears stated age  Head:    Normocephalic, without obvious abnormality, atraumatic  Eyes:    PERRL, conjunctiva/corneas clear, EOM's intact, fundi    benign, both eyes  Ears:    Normal TM's and external ear canals, both ears  Nose:   Nares normal, septum midline, mucosa normal, no drainage    or sinus tenderness  Throat:   Lips, mucosa, and tongue normal; teeth and gums normal  Neck:   Supple, symmetrical, trachea midline, no adenopathy;    Thyroid: diffuse enlargement  Back:     Symmetric, no curvature, ROM normal, no CVA tenderness  Lungs:     Clear to auscultation bilaterally, respirations unlabored  Chest Wall:    No tenderness or deformity   Heart:    Regular rate and rhythm, S1 and S2 normal, no murmur, rub   or gallop  Breast Exam:    Deferred to GYN  Abdomen:     Soft, non-tender, bowel sounds active all four quadrants,    no masses, no organomegaly  Genitalia:    Deferred to GYN  Rectal:    Extremities:   Extremities normal,  atraumatic, no cyanosis or edema  Pulses:   2+ and symmetric all extremities  Skin:   Skin color, texture, turgor normal, no rashes or lesions  Lymph nodes:   Cervical, supraclavicular, and axillary nodes normal  Neurologic:   CNII-XII intact, normal strength, sensation and reflexes    throughout          Assessment & Plan:

## 2016-04-16 NOTE — Progress Notes (Signed)
Pre visit review using our clinic review tool, if applicable. No additional management support is needed unless otherwise documented below in the visit note. 

## 2016-04-17 ENCOUNTER — Encounter: Payer: Self-pay | Admitting: General Practice

## 2016-04-17 NOTE — Assessment & Plan Note (Signed)
Due for repeat US- order entered

## 2016-04-17 NOTE — Assessment & Plan Note (Signed)
Pt's PE WNL w/ exception of being overweight and known thyromegaly.  UTD on pap, due for mammo.  Asking for referral to GYN- referral placed.  Check labs.  Anticipatory guidance provided.

## 2016-04-24 ENCOUNTER — Encounter: Payer: Self-pay | Admitting: Family Medicine

## 2016-07-17 ENCOUNTER — Encounter: Payer: Self-pay | Admitting: General Practice

## 2018-02-23 ENCOUNTER — Encounter: Payer: Self-pay | Admitting: General Practice

## 2018-06-11 ENCOUNTER — Other Ambulatory Visit: Payer: Self-pay

## 2018-06-11 ENCOUNTER — Ambulatory Visit (INDEPENDENT_AMBULATORY_CARE_PROVIDER_SITE_OTHER): Payer: BLUE CROSS/BLUE SHIELD | Admitting: Family Medicine

## 2018-06-11 ENCOUNTER — Encounter: Payer: Self-pay | Admitting: Family Medicine

## 2018-06-11 VITALS — BP 120/74 | HR 66 | Temp 99.0°F | Resp 15 | Ht 65.75 in | Wt 188.8 lb

## 2018-06-11 DIAGNOSIS — E559 Vitamin D deficiency, unspecified: Secondary | ICD-10-CM | POA: Diagnosis not present

## 2018-06-11 DIAGNOSIS — Z1231 Encounter for screening mammogram for malignant neoplasm of breast: Secondary | ICD-10-CM

## 2018-06-11 DIAGNOSIS — E669 Obesity, unspecified: Secondary | ICD-10-CM | POA: Diagnosis not present

## 2018-06-11 DIAGNOSIS — Z0000 Encounter for general adult medical examination without abnormal findings: Secondary | ICD-10-CM | POA: Diagnosis not present

## 2018-06-11 DIAGNOSIS — Z1239 Encounter for other screening for malignant neoplasm of breast: Secondary | ICD-10-CM

## 2018-06-11 DIAGNOSIS — E010 Iodine-deficiency related diffuse (endemic) goiter: Secondary | ICD-10-CM

## 2018-06-11 DIAGNOSIS — Z114 Encounter for screening for human immunodeficiency virus [HIV]: Secondary | ICD-10-CM | POA: Diagnosis not present

## 2018-06-11 LAB — LIPID PANEL
CHOLESTEROL: 176 mg/dL (ref 0–200)
HDL: 64.9 mg/dL (ref >39.00)
LDL CALC: 88 mg/dL (ref 0–99)
NonHDL: 111.31
Total CHOL/HDL Ratio: 3
Triglycerides: 118 mg/dL (ref 0.0–149.0)
VLDL: 23.6 mg/dL (ref 0.0–40.0)

## 2018-06-11 LAB — HEPATIC FUNCTION PANEL
ALK PHOS: 52 U/L (ref 39–117)
ALT: 11 U/L (ref 0–35)
AST: 14 U/L (ref 0–37)
Albumin: 4 g/dL (ref 3.5–5.2)
BILIRUBIN DIRECT: 0.1 mg/dL (ref 0.0–0.3)
TOTAL PROTEIN: 6.9 g/dL (ref 6.0–8.3)
Total Bilirubin: 0.4 mg/dL (ref 0.2–1.2)

## 2018-06-11 LAB — VITAMIN D 25 HYDROXY (VIT D DEFICIENCY, FRACTURES): VITD: 20.47 ng/mL — ABNORMAL LOW (ref 30.00–100.00)

## 2018-06-11 LAB — BASIC METABOLIC PANEL
BUN: 9 mg/dL (ref 6–23)
CO2: 28 meq/L (ref 19–32)
Calcium: 9.4 mg/dL (ref 8.4–10.5)
Chloride: 104 mEq/L (ref 96–112)
Creatinine, Ser: 0.89 mg/dL (ref 0.40–1.20)
GFR: 87.72 mL/min (ref >60.00)
GLUCOSE: 89 mg/dL (ref 70–99)
POTASSIUM: 4.2 meq/L (ref 3.5–5.1)
SODIUM: 138 meq/L (ref 135–145)

## 2018-06-11 LAB — CBC WITH DIFFERENTIAL/PLATELET
BASOS ABS: 0 10*3/uL (ref 0.0–0.1)
Basophils Relative: 0.6 % (ref 0.0–3.0)
Eosinophils Absolute: 0.1 10*3/uL (ref 0.0–0.7)
Eosinophils Relative: 1.3 % (ref 0.0–5.0)
HCT: 36.8 % (ref 36.0–46.0)
Hemoglobin: 11.6 g/dL — ABNORMAL LOW (ref 12.0–15.0)
LYMPHS ABS: 2 10*3/uL (ref 0.7–4.0)
Lymphocytes Relative: 36.8 % (ref 12.0–46.0)
MCHC: 31.5 g/dL (ref 30.0–36.0)
MCV: 73.7 fl — ABNORMAL LOW (ref 78.0–100.0)
Monocytes Absolute: 0.4 10*3/uL (ref 0.1–1.0)
Monocytes Relative: 7.3 % (ref 3.0–12.0)
NEUTROS ABS: 3 10*3/uL (ref 1.4–7.7)
NEUTROS PCT: 54 % (ref 43.0–77.0)
PLATELETS: 167 10*3/uL (ref 150.0–400.0)
RBC: 4.99 Mil/uL (ref 3.87–5.11)
RDW: 14.6 % (ref 11.5–15.5)
WBC: 5.5 10*3/uL (ref 4.0–10.5)

## 2018-06-11 LAB — TSH: TSH: 0.44 u[IU]/mL (ref 0.35–4.50)

## 2018-06-11 NOTE — Patient Instructions (Signed)
Follow up in 1 year or as needed We'll notify you of your lab results and make any changes if needed We'll call you with your ultrasound and mammogram appts Continue to work on healthy diet and regular exercise- you're doing great Call with any questions or concerns Have a great weekend!

## 2018-06-11 NOTE — Progress Notes (Signed)
   Subjective:    Patient ID: Lori Gibson, female    DOB: 11/02/1970, 47 y.o.   MRN: [id number]  HPI CPE- overdue on mammogram, pap ([place]).  UTD on Tdap.  Declines flu shot.  Exercising 2-3x/week at [facility] for 45 minutes.   Review of Systems Patient reports no vision/ hearing changes, adenopathy,fever, weight change,  persistant/recurrent hoarseness , swallowing issues, chest pain, palpitations, edema, persistant/recurrent cough, hemoptysis, dyspnea (rest/exertional/paroxysmal nocturnal), gastrointestinal bleeding (melena, rectal bleeding), abdominal pain, significant heartburn, bowel changes, GU symptoms (dysuria, hematuria, incontinence), Gyn symptoms (abnormal  bleeding, pain),  syncope, focal weakness, memory loss, numbness & tingling, skin/hair/nail changes, abnormal bruising or bleeding, anxiety, or depression.     Objective:   Physical Exam General Appearance:    Alert, cooperative, no distress, appears stated age  Head:    Normocephalic, without obvious abnormality, atraumatic  Eyes:    PERRL, conjunctiva/corneas clear, EOM's intact, fundi    benign, both eyes  Ears:    Normal TM's and external ear canals, both ears  Nose:   Nares normal, septum midline, mucosa normal, no drainage    or sinus tenderness  Throat:   Lips, mucosa, and tongue normal; teeth and gums normal  Neck:   Supple, symmetrical, trachea midline, no adenopathy;    Thyroid: thyromegaly  Back:     Symmetric, no curvature, ROM normal, no CVA tenderness  Lungs:     Clear to auscultation bilaterally, respirations unlabored  Chest Wall:    No tenderness or deformity   Heart:    Regular rate and rhythm, S1 and S2 normal, no murmur, rub   or gallop  Breast Exam:    Deferred to mammo  Abdomen:     Soft, non-tender, bowel sounds active all four quadrants,    no masses, no organomegaly  Genitalia:    Deferred to GYN  Rectal:    Extremities:   Extremities normal, atraumatic, no cyanosis or edema  Pulses:   2+  and symmetric all extremities  Skin:   Skin color, texture, turgor normal, no rashes or lesions  Lymph nodes:   Cervical, supraclavicular, and axillary nodes normal  Neurologic:   CNII-XII intact, normal strength, sensation and reflexes    throughout          Assessment & Plan:

## 2018-06-11 NOTE — Assessment & Plan Note (Signed)
Pt's PE WNL w/ exception of thyromegaly and obesity.  Overdue for pap/mammo.  Mammo ordered, encouraged pt to call GYN to schedule pap.  Check labs.  Anticipatory guidance provided.

## 2018-06-11 NOTE — Assessment & Plan Note (Signed)
Ongoing issue for pt.  She has not had an [place] since 2013.  No obvious nodularity but will repeat US to assess for growth or change.

## 2018-06-12 LAB — HIV ANTIBODY (ROUTINE TESTING W REFLEX): HIV: NONREACTIVE

## 2018-06-14 ENCOUNTER — Other Ambulatory Visit: Payer: Self-pay

## 2018-06-14 MED ORDER — VITAMIN D (ERGOCALCIFEROL) 1.25 MG (50000 UNIT) PO CAPS: 50000.0000 [IU] | ORAL_CAPSULE | ORAL | 0 refills | Status: AC

## 2021-04-03 ENCOUNTER — Encounter: Payer: Self-pay | Admitting: *Deleted
